# Patient Record
Sex: Male | Born: 1980 | Race: White | Hispanic: No | Marital: Single | Smoking: Current every day smoker
Health system: Southern US, Community
[De-identification: ages and names within clinical notes are randomized; demographics above are authoritative.]

## PROBLEM LIST (undated history)

## (undated) DIAGNOSIS — M549 Dorsalgia, unspecified: Secondary | ICD-10-CM

---

## 2011-05-23 ENCOUNTER — Emergency Department (HOSPITAL_COMMUNITY)
Admission: EM | Admit: 2011-05-23 | Discharge: 2011-05-23 | Disposition: A | Discharge status: Home / Self Care | Payer: Self-pay | Attending: Emergency Medicine | Admitting: Emergency Medicine

## 2011-05-23 ENCOUNTER — Encounter: Payer: Self-pay | Admitting: Nurse Practitioner

## 2011-05-23 DIAGNOSIS — S61209A Unspecified open wound of unspecified finger without damage to nail, initial encounter: Secondary | ICD-10-CM | POA: Insufficient documentation

## 2011-05-23 DIAGNOSIS — S61259A Open bite of unspecified finger without damage to nail, initial encounter: Secondary | ICD-10-CM

## 2011-05-23 DIAGNOSIS — W540XXA Bitten by dog, initial encounter: Secondary | ICD-10-CM | POA: Insufficient documentation

## 2011-05-23 DIAGNOSIS — S61219A Laceration without foreign body of unspecified finger without damage to nail, initial encounter: Secondary | ICD-10-CM

## 2011-05-23 MED ORDER — TETANUS-DIPHTH-ACELL PERTUSSIS 5-2.5-18.5 LF-MCG/0.5 IM SUSP
0.5000 mL | Freq: Once | INTRAMUSCULAR | Status: AC
  Administered 2011-05-23: 0.5 mL via INTRAMUSCULAR
  Filled 2011-05-23: qty 0.5

## 2011-05-23 MED ORDER — DOXYCYCLINE HYCLATE 100 MG PO CAPS: 100.0000 mg | ORAL_CAPSULE | Freq: Two times a day (BID) | ORAL | Status: AC

## 2011-05-23 NOTE — ED Notes (Signed)
Pt bit by known dog today. Pt with punctures to R hand. Small abrasion to L hand. Pt does not know if the dog has rabies vaccine. Cms intact.

## 2011-05-23 NOTE — ED Provider Notes (Signed)
History     CSN: [id number] Arrival date & time: 05/23/2011 12:16 PM   First MD Initiated Contact with Patient 05/23/11 1254      Chief Complaint  Patient presents with  . Animal Bite    (Consider location/radiation/quality/duration/timing/severity/associated sxs/prior treatment) Patient is a 30 y.o. male presenting with animal bite. The history is provided by the patient.  Animal Bite  The incident occurred today. The incident occurred at home. He came to the ER via personal transport. There is an injury to the right hand. There is an injury to the right long finger. The pain is moderate. It is unlikely that a foreign body is present. Pertinent negatives include no tingling and no weakness. There have been no prior injuries to these areas. He is right-handed. His tetanus status is out of date. He has been behaving normally. He has received no recent medical care.    History reviewed. No pertinent past medical history.  History reviewed. No pertinent past surgical history.  History reviewed. No pertinent family history.  History  Substance Use Topics  . Smoking status: Current Everyday Smoker -- 0.5 packs/day  . Smokeless tobacco: Not on file  . Alcohol Use:      rare      Review of Systems  Neurological: Negative for tingling and weakness.  All other systems reviewed and are negative.    Allergies  Review of patient's allergies indicates no known allergies.  Home Medications  No current outpatient prescriptions on file.  There were no vitals taken for this visit.  Physical Exam  Constitutional: He is oriented to person, place, and time. He appears well-developed and well-nourished. No distress.  HENT:  Head: Normocephalic and atraumatic.  Eyes: EOM are normal. Pupils are equal, round, and reactive to light.  Musculoskeletal:       Right hand: He exhibits tenderness, disruption of two-point discrimination and laceration. He exhibits normal range of motion and  normal capillary refill. normal sensation noted. Normal strength noted. He exhibits no finger abduction, no thumb/finger opposition and no wrist extension trouble.       Hands:      Several puncture wounds and lacerations over the right hand.  Deep laceration over the PIP joint on the 3rd finger  Neurological: He is alert and oriented to person, place, and time.  Skin: Skin is warm and dry.    ED Course  Procedures (including critical care time)  Labs Reviewed - No data to display No results found.  LACERATION REPAIR Performed by: Gwyneth Sprout Authorized byGwyneth Sprout Consent: Verbal consent obtained. Risks and benefits: risks, benefits and alternatives were discussed Consent given by: patient Patient identity confirmed: provided demographic data Prepped and Draped in normal sterile fashion Wound explored  Laceration Location: right 3rd finger over the PIP   Laceration Length: 3cm  No Foreign Bodies seen or palpated  Anesthesia: local infiltration  Local anesthetic: lidocaine 1%   Anesthetic total: 3 ml  Irrigation method: syringe Amount of cleaning: excessive  Skin closure: prolene 4.0  Number of sutures: 5  Technique: simple interrupted  Patient tolerance: Patient tolerated the procedure well with no immediate complications.   No diagnosis found.    MDM   Pt bit by pitbull in the hand.  No signs of ligamentous injury and good sensation and pulse.  1 laceration due to being over the PIP and being deep will have to suture.  Will place on augmentin and will give tetanus.  Pt states he knows the dog  and can make sure it has it's vaccinations.        Gwyneth Sprout, MD 05/23/11 1426

## 2014-10-04 ENCOUNTER — Ambulatory Visit: Payer: Self-pay | Admitting: Family

## 2015-08-22 ENCOUNTER — Emergency Department (HOSPITAL_COMMUNITY): Payer: Self-pay

## 2015-08-22 ENCOUNTER — Emergency Department (HOSPITAL_COMMUNITY)
Admission: EM | Admit: 2015-08-22 | Discharge: 2015-08-22 | Disposition: A | Discharge status: Home / Self Care | Payer: Self-pay | Attending: Emergency Medicine | Admitting: Emergency Medicine

## 2015-08-22 ENCOUNTER — Encounter (HOSPITAL_COMMUNITY): Payer: Self-pay | Admitting: Emergency Medicine

## 2015-08-22 DIAGNOSIS — M545 Low back pain, unspecified: Secondary | ICD-10-CM

## 2015-08-22 DIAGNOSIS — Y9389 Activity, other specified: Secondary | ICD-10-CM | POA: Insufficient documentation

## 2015-08-22 DIAGNOSIS — S3992XA Unspecified injury of lower back, initial encounter: Secondary | ICD-10-CM | POA: Insufficient documentation

## 2015-08-22 DIAGNOSIS — X500XXA Overexertion from strenuous movement or load, initial encounter: Secondary | ICD-10-CM | POA: Insufficient documentation

## 2015-08-22 DIAGNOSIS — Y9289 Other specified places as the place of occurrence of the external cause: Secondary | ICD-10-CM | POA: Insufficient documentation

## 2015-08-22 DIAGNOSIS — Y990 Civilian activity done for income or pay: Secondary | ICD-10-CM | POA: Insufficient documentation

## 2015-08-22 DIAGNOSIS — F17200 Nicotine dependence, unspecified, uncomplicated: Secondary | ICD-10-CM | POA: Insufficient documentation

## 2015-08-22 HISTORY — DX: Dorsalgia, unspecified: M54.9

## 2015-08-22 MED ORDER — NAPROXEN 500 MG PO TABS: 500.0000 mg | ORAL_TABLET | Freq: Two times a day (BID) | ORAL | Status: AC

## 2015-08-22 MED ORDER — NAPROXEN 500 MG PO TABS
500.0000 mg | ORAL_TABLET | Freq: Once | ORAL | Status: AC
  Administered 2015-08-22: 500 mg via ORAL
  Filled 2015-08-22: qty 1

## 2015-08-22 MED ORDER — OXYCODONE-ACETAMINOPHEN 5-325 MG PO TABS
1.0000 | ORAL_TABLET | Freq: Once | ORAL | Status: AC
  Administered 2015-08-22: 1 via ORAL
  Filled 2015-08-22: qty 1

## 2015-08-22 MED ORDER — METHOCARBAMOL 500 MG PO TABS: 500.0000 mg | ORAL_TABLET | Freq: Two times a day (BID) | ORAL | Status: AC

## 2015-08-22 MED ORDER — OXYCODONE-ACETAMINOPHEN 5-325 MG PO TABS: 2.0000 | ORAL_TABLET | ORAL | Status: AC | PRN

## 2015-08-22 NOTE — ED Notes (Signed)
Patient transported to X-ray 

## 2015-08-22 NOTE — Discharge Instructions (Signed)

## 2015-08-22 NOTE — ED Provider Notes (Signed)
CSN: [id number]     Arrival date & time 08/22/15  1854 History  By signing my name below, I, Octavia Heir, attest that this documentation has been prepared under the direction and in the presence of [employer], PA-C. Electronically Signed: Octavia Heir, ED Scribe. 08/22/2015. 7:48 PM.    Chief Complaint  Patient presents with  . Back Pain   The history is provided by the patient. No language interpreter was used.   HPI Comments: Francisco Daniels is a 35 y.o. male who presents to the Emergency Department complaining of constant, gradual worsening, moderate, sharp shooting back pain onset two days ago. Pt states he lifted a box at work when he bent down at his knees and injured his back. The next day at work he said he did light weight but once he got him it was painful to move. He reports that certain positions can help alleviate his pain minimally but it is painful when he sits and ambulates. He has not taken any medication to alleviate his pain. States he did this previously after a coughing fit.  Denies bladder/bowel incontinence, weakness in legs. Denies being on prednisone or history of cancer.  Past Medical History  Diagnosis Date  . Back pain    History reviewed. No pertinent past surgical history. History reviewed. No pertinent family history. Social History  Substance Use Topics  . Smoking status: Current Every Day Smoker -- 0.50 packs/day  . Smokeless tobacco: None  . Alcohol Use: Yes     Comment: rare    Review of Systems  Musculoskeletal: Positive for back pain.  All other systems reviewed and are negative.     Allergies  Review of patient's allergies indicates no known allergies.  Home Medications   Prior to Admission medications   Medication Sig Start Date End Date Taking? Authorizing Provider  methocarbamol (ROBAXIN) 500 MG tablet Take 1 tablet (500 mg total) by mouth 2 (two) times daily. 08/22/15   Makayela Secrest Patel-Mills, PA-C  naproxen (NAPROSYN) 500 MG tablet  Take 1 tablet (500 mg total) by mouth 2 (two) times daily. 08/22/15   Elzada Pytel Patel-Mills, PA-C  oxyCODONE-acetaminophen (PERCOCET/ROXICET) 5-325 MG tablet Take 2 tablets by mouth every 4 (four) hours as needed for severe pain. 08/22/15   Catha Gosselin, PA-C   Triage vitals: BP 125/86 mmHg  Pulse 98  Resp 18  Ht  (1.981 m)  Wt 265 lb (120.203 kg)  BMI 30.63 kg/m2  SpO2 97% Physical Exam  Constitutional: He is oriented to person, place, and time. He appears well-developed and well-nourished.  HENT:  Head: Normocephalic and atraumatic.  Eyes: Conjunctivae are normal.  Neck: Normal range of motion. Neck supple.  Cardiovascular: Normal rate, regular rhythm and normal heart sounds.   Pulmonary/Chest: Effort normal and breath sounds normal. No respiratory distress.  Abdominal: Soft. He exhibits no distension. There is no tenderness.  Musculoskeletal: Normal range of motion. He exhibits tenderness. He exhibits no edema.  Midline lumbar vertebral tenderness, no paravertebral tenderness.  No lower extremity weakness or saddle anesthesia.  Ambulatory with steady gait. No ecchymosis or erythema long the back.   Neurological: He is alert and oriented to person, place, and time.  Skin: Skin is warm and dry.  Psychiatric: He has a normal mood and affect.  Nursing note and vitals reviewed.   ED Course  Procedures  DIAGNOSTIC STUDIES: Oxygen Saturation is 97% on RA, normal by my interpretation.  COORDINATION OF CARE:  7:48 PM Discussed treatment plan with pt  at bedside and pt agreed to plan.  Labs Review Labs Reviewed - No data to display  Imaging Review Dg Lumbar Spine Complete  08/22/2015  CLINICAL DATA:  36 year old male with back pain after lifting a box EXAM: LUMBAR SPINE - COMPLETE 4+ VIEW COMPARISON:  None. FINDINGS: There is no acute fracture or subluxation. Minimal dextroscoliosis centered at L2. The vertebral body heights and disc spaces are maintained. The bones are well  mineralized. There is apparent slight step-off of the tip of the right L3 transverse process which may be artifactual. A fracture is not excluded. The remainder of the visualized transverse and spinous processes appear intact. The soft tissues appear unremarkable. IMPRESSION: No acute vertebral body fracture or subluxation. Artifact versus less likely minimally displaced fracture of the tip of the right L3 transverse process. Electronically Signed   By: Elgie Collard M.D.   On: 08/22/2015 20:21   I have personally reviewed and evaluated these image results as part of my medical decision-making.   EKG Interpretation None      MDM   Final diagnoses:  Midline low back pain without sciatica  Patient presents for low back pain after lifting a heavy box while at work.  Felt a pop.  No treatment PTA. No neurological deficits and normal neuro exam. Patient is ambulatory and without loss of bowel or bladder control. No concerns for cauda equina. No fever, night sweats, weight loss, h/o cancer, IVDA, no recent procedure to back. No urinary symptoms suggestive of UTI.  Xray of lumbar spine shows artifact versus less likely minimally displaced fracture of the tip of the right L3 transverse process. No spinal cord impingement. Supportive care and return precaution discussed. Patient was given orthopedic follow-up. He was also given a work note. Appears safe for discharge at this time. Follow up as indicated in discharge paperwork.   I personally performed the services described in this documentation, which was scribed in my presence. The recorded information has been reviewed and is accurate.   Catha Gosselin, PA-C 08/23/15 1600  Alvira Monday, MD 08/23/15 1642

## 2015-08-22 NOTE — ED Notes (Signed)
Pt hurt back at work while lifting something

## 2016-12-18 IMAGING — CR DG LUMBAR SPINE COMPLETE 4+V
5 series · 5 of 5 positions shown · non-contrast
Comparison: None.

CLINICAL DATA: 34-year-old male with back pain after lifting a box

EXAM:
LUMBAR SPINE - COMPLETE 4+ VIEW

[t lumbar spine ap]
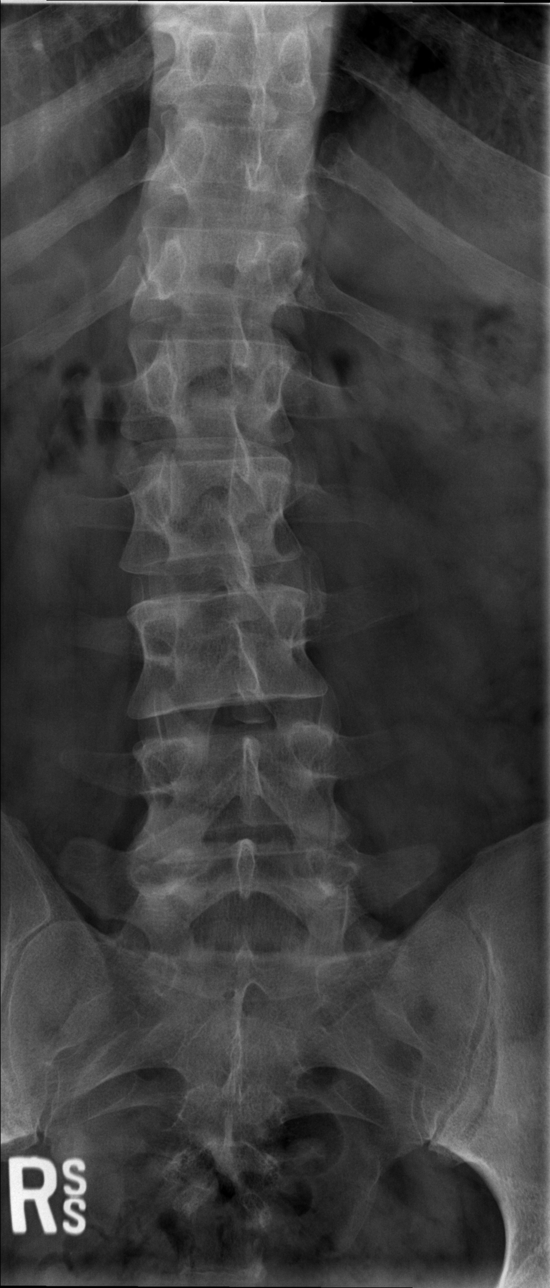

[t lumbar spine obl (1 of 2)]
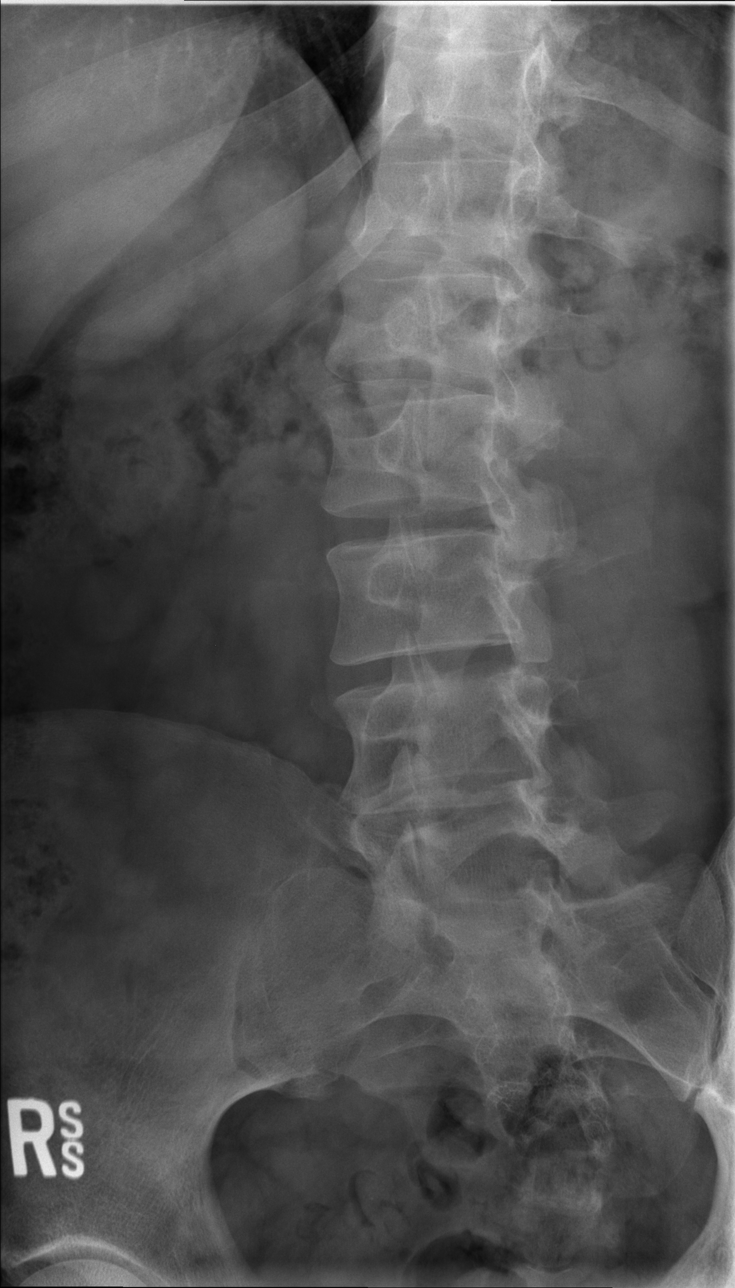

[t lumbar spine obl (2 of 2)]
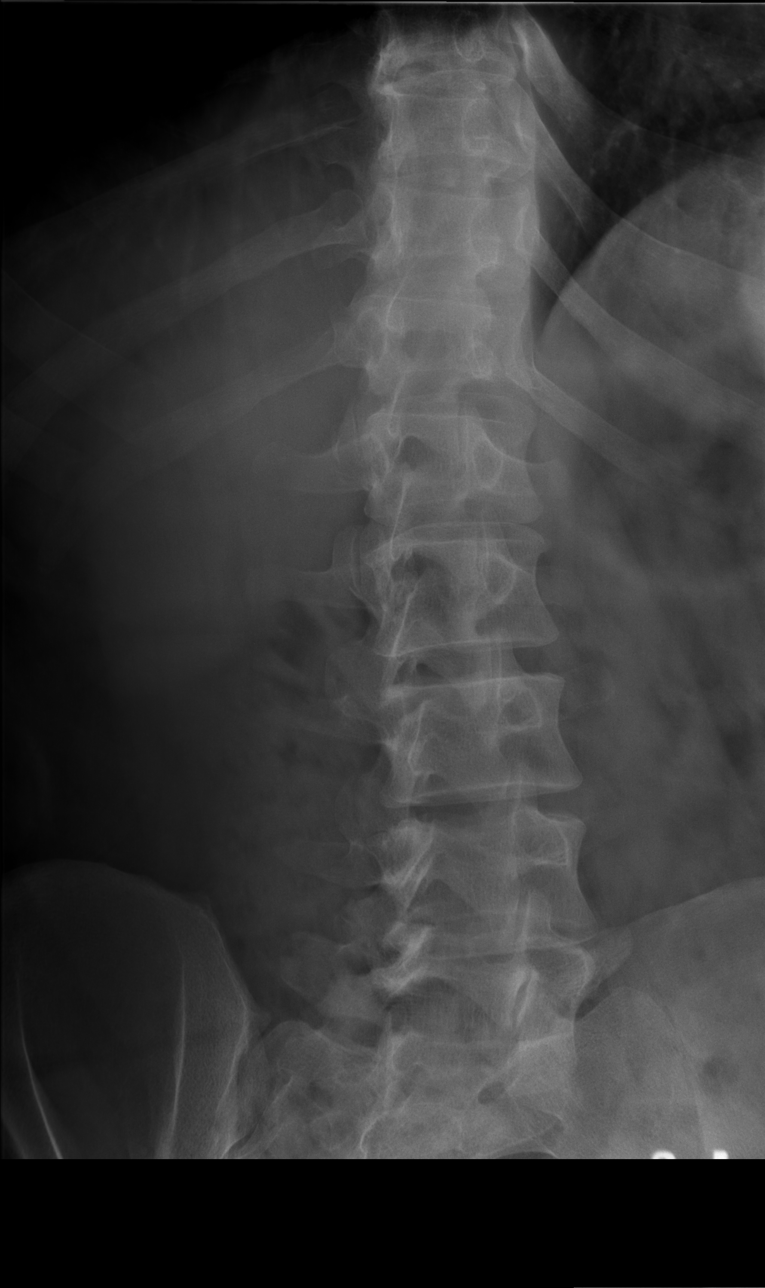

[t lumbar spine lat]
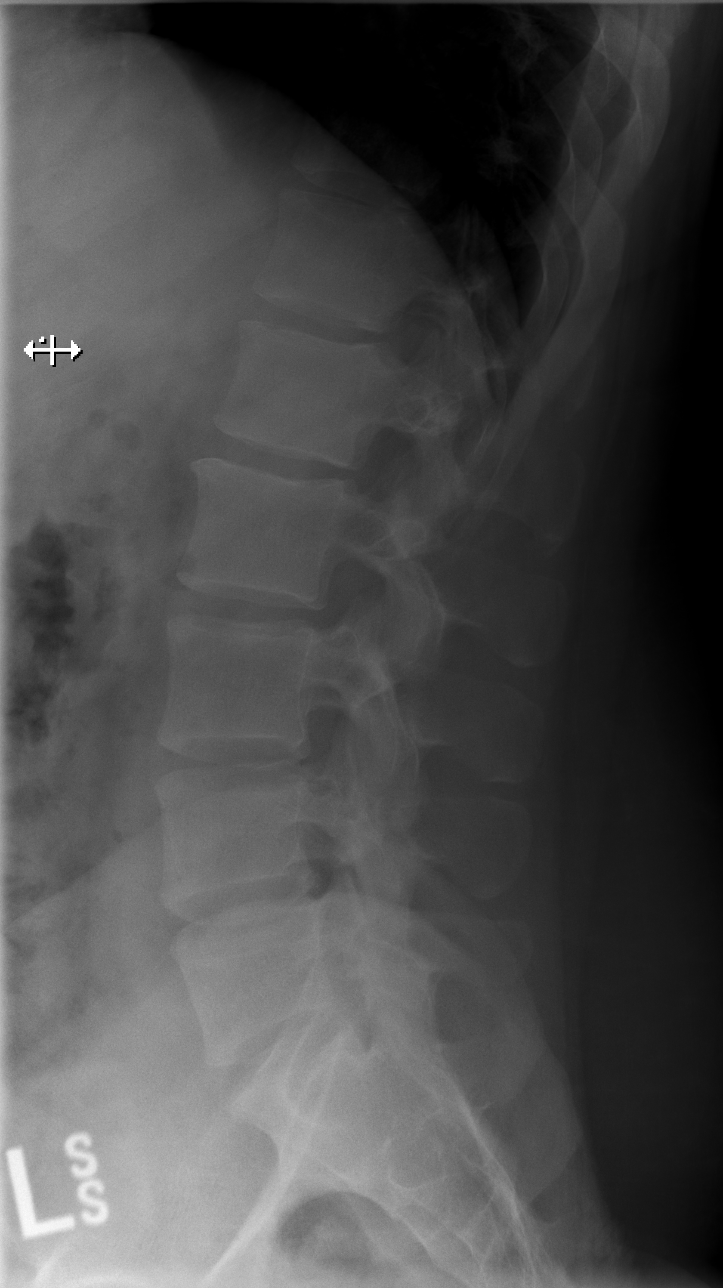

[t lumbar l-5 s-1 spot]
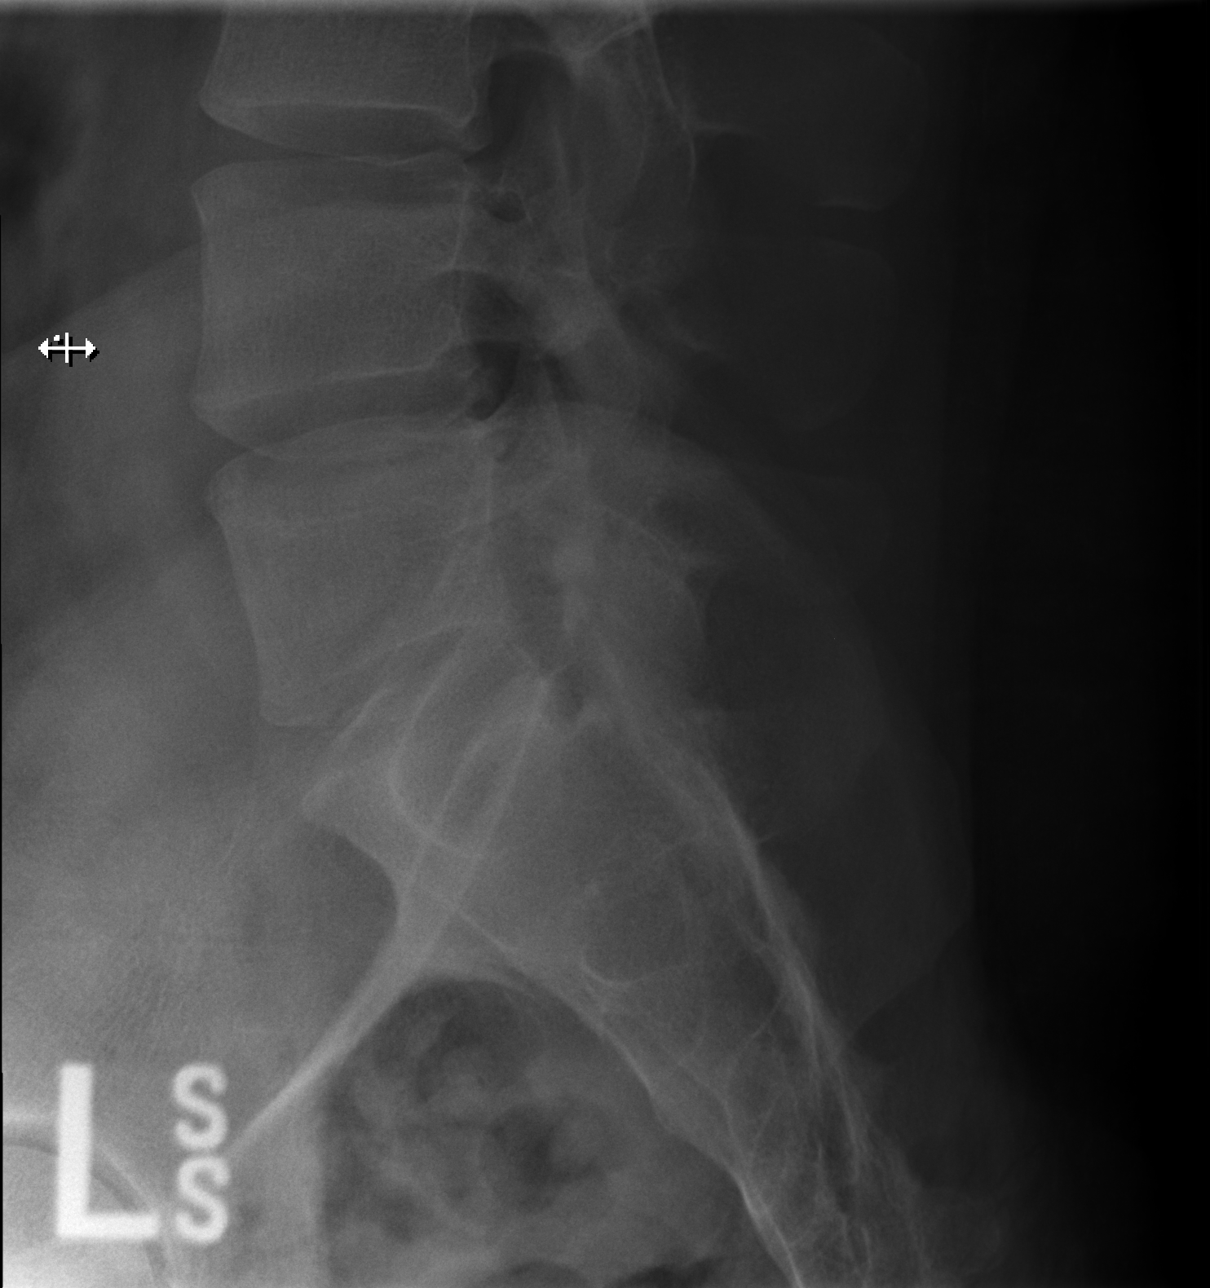

[5 of 5 positions shown; findings below may reference images not displayed]

FINDINGS: There is no acute fracture or subluxation. Minimal dextroscoliosis
centered at L2. The vertebral body heights and disc spaces are
maintained. The bones are well mineralized. There is apparent slight
step-off of the tip of the right L3 transverse process which may be
artifactual. A fracture is not excluded. The remainder of the
visualized transverse and spinous processes appear intact. The soft
tissues appear unremarkable.
IMPRESSION: No acute vertebral body fracture or subluxation.

Artifact versus less likely minimally displaced fracture of the tip
of the right L3 transverse process.
